# Patient Record
Sex: Male | Born: 1945 | Race: White | Hispanic: No
Health system: Southern US, Community
[De-identification: ages and names within clinical notes are randomized; demographics above are authoritative.]

## PROBLEM LIST (undated history)

## (undated) DIAGNOSIS — E119 Type 2 diabetes mellitus without complications: Secondary | ICD-10-CM

## (undated) HISTORY — DX: Type 2 diabetes mellitus without complications: E11.9

---

## 2003-08-14 ENCOUNTER — Other Ambulatory Visit: Admission: RE | Admit: 2003-08-14 | Discharge: 2003-08-14 | Payer: Self-pay | Admitting: Otolaryngology

## 2003-08-21 ENCOUNTER — Encounter: Admission: RE | Admit: 2003-08-21 | Discharge: 2003-08-21 | Payer: Self-pay | Admitting: Otolaryngology

## 2003-09-04 ENCOUNTER — Encounter (INDEPENDENT_AMBULATORY_CARE_PROVIDER_SITE_OTHER): Payer: Self-pay | Admitting: *Deleted

## 2003-09-04 ENCOUNTER — Ambulatory Visit (HOSPITAL_COMMUNITY): Admission: RE | Admit: 2003-09-04 | Discharge: 2003-09-04 | Payer: Self-pay | Admitting: Otolaryngology

## 2003-10-12 ENCOUNTER — Encounter: Admission: RE | Admit: 2003-10-12 | Discharge: 2003-10-12 | Payer: Self-pay | Admitting: Otolaryngology

## 2003-10-25 ENCOUNTER — Encounter (INDEPENDENT_AMBULATORY_CARE_PROVIDER_SITE_OTHER): Payer: Self-pay | Admitting: Specialist

## 2003-10-25 ENCOUNTER — Ambulatory Visit (HOSPITAL_COMMUNITY): Admission: RE | Admit: 2003-10-25 | Discharge: 2003-10-26 | Payer: Self-pay | Admitting: Otolaryngology

## 2004-09-09 ENCOUNTER — Encounter: Admission: RE | Admit: 2004-09-09 | Discharge: 2004-09-09 | Payer: Self-pay | Admitting: Otolaryngology

## 2006-09-14 ENCOUNTER — Ambulatory Visit: Payer: Self-pay | Admitting: Hematology and Oncology

## 2006-09-24 LAB — CBC WITH DIFFERENTIAL/PLATELET
BASO%: 0.7 % (ref 0.0–2.0)
HCT: 53.4 % — ABNORMAL HIGH (ref 38.7–49.9)
LYMPH%: 26.9 % (ref 14.0–48.0)
MCH: 30.5 pg (ref 28.0–33.4)
MCV: 86.6 fL (ref 81.6–98.0)
MONO%: 6.2 % (ref 0.0–13.0)
NEUT#: 4.9 10*3/uL (ref 1.5–6.5)
NEUT%: 65.7 % (ref 40.0–75.0)
Platelets: 150 10*3/uL (ref 145–400)
WBC: 7.5 10*3/uL (ref 4.0–10.0)

## 2006-09-25 ENCOUNTER — Ambulatory Visit (HOSPITAL_COMMUNITY): Admission: RE | Admit: 2006-09-25 | Discharge: 2006-09-25 | Payer: Self-pay | Admitting: Hematology and Oncology

## 2006-09-29 LAB — COMPREHENSIVE METABOLIC PANEL
ALT: 20 U/L (ref 0–53)
Albumin: 4.3 g/dL (ref 3.5–5.2)
Alkaline Phosphatase: 71 U/L (ref 39–117)
CO2: 19 mEq/L (ref 19–32)
Calcium: 8.8 mg/dL (ref 8.4–10.5)
Creatinine, Ser: 0.97 mg/dL (ref 0.40–1.50)
Glucose, Bld: 107 mg/dL — ABNORMAL HIGH (ref 70–99)
Potassium: 3.9 mEq/L (ref 3.5–5.3)
Sodium: 138 mEq/L (ref 135–145)
Total Bilirubin: 0.9 mg/dL (ref 0.3–1.2)
Total Protein: 6.3 g/dL (ref 6.0–8.3)

## 2006-09-29 LAB — VITAMIN B12: Vitamin B-12: 216 pg/mL (ref 211–911)

## 2006-09-29 LAB — ERYTHROPOIETIN: Erythropoietin: 6.8 m[IU]/mL (ref 2.6–34.0)

## 2006-10-02 LAB — VON WILLEBRAND PANEL
Factor-VIII Activity: 89 % (ref 75–150)
Ristocetin-Cofactor: 129 % (ref 50–150)
Von Willebrand Ag: 123 % normal (ref 61–164)

## 2006-10-29 ENCOUNTER — Ambulatory Visit: Payer: Self-pay | Admitting: Emergency Medicine

## 2006-10-29 LAB — CONVERTED CEMR LAB
CO2: 29 meq/L (ref 19–32)
Calcium: 8.8 mg/dL (ref 8.4–10.5)
Chloride: 106 meq/L (ref 96–112)
Glucose, Bld: 73 mg/dL (ref 70–99)
Potassium: 3.8 meq/L (ref 3.5–5.1)

## 2006-11-02 DIAGNOSIS — J984 Other disorders of lung: Secondary | ICD-10-CM | POA: Insufficient documentation

## 2006-11-02 DIAGNOSIS — K8020 Calculus of gallbladder without cholecystitis without obstruction: Secondary | ICD-10-CM | POA: Insufficient documentation

## 2006-11-02 DIAGNOSIS — K5732 Diverticulitis of large intestine without perforation or abscess without bleeding: Secondary | ICD-10-CM | POA: Insufficient documentation

## 2006-11-02 DIAGNOSIS — I739 Peripheral vascular disease, unspecified: Secondary | ICD-10-CM

## 2006-11-27 ENCOUNTER — Ambulatory Visit: Payer: Self-pay | Admitting: Hematology and Oncology

## 2006-11-30 ENCOUNTER — Ambulatory Visit: Payer: Self-pay | Admitting: Cardiology

## 2006-12-01 LAB — CBC WITH DIFFERENTIAL/PLATELET
BASO%: 0.2 % (ref 0.0–2.0)
EOS%: 0.7 % (ref 0.0–7.0)
HGB: 18.9 g/dL — ABNORMAL HIGH (ref 13.0–17.1)
MCH: 30.4 pg (ref 28.0–33.4)
MCHC: 34.5 g/dL (ref 32.0–35.9)
MONO%: 5.8 % (ref 0.0–13.0)
RBC: 6.2 10*6/uL — ABNORMAL HIGH (ref 4.20–5.71)
RDW: 14.6 % (ref 11.2–14.6)
lymph#: 2.1 10*3/uL (ref 0.9–3.3)

## 2006-12-09 ENCOUNTER — Other Ambulatory Visit: Admission: RE | Admit: 2006-12-09 | Discharge: 2006-12-09 | Payer: Self-pay | Admitting: Hematology and Oncology

## 2006-12-09 LAB — CBC WITH DIFFERENTIAL/PLATELET
Basophils Absolute: 0.1 10*3/uL (ref 0.0–0.1)
Eosinophils Absolute: 0.1 10*3/uL (ref 0.0–0.5)
HGB: 18.2 g/dL — ABNORMAL HIGH (ref 13.0–17.1)
MONO#: 0.5 10*3/uL (ref 0.1–0.9)
MONO%: 6.3 % (ref 0.0–13.0)
NEUT#: 5.5 10*3/uL (ref 1.5–6.5)
RBC: 5.96 10*6/uL — ABNORMAL HIGH (ref 4.20–5.71)
RDW: 14.2 % (ref 11.2–14.6)
WBC: 8.2 10*3/uL (ref 4.0–10.0)
lymph#: 2 10*3/uL (ref 0.9–3.3)

## 2006-12-09 LAB — ERYTHROPOIETIN: Erythropoietin: 7.1 m[IU]/mL (ref 2.6–34.0)

## 2007-01-29 ENCOUNTER — Ambulatory Visit: Payer: Self-pay | Admitting: Hematology and Oncology

## 2007-02-02 LAB — CBC WITH DIFFERENTIAL/PLATELET
BASO%: 0.5 % (ref 0.0–2.0)
Eosinophils Absolute: 0 10*3/uL (ref 0.0–0.5)
HCT: 54.8 % — ABNORMAL HIGH (ref 38.7–49.9)
HGB: 18.9 g/dL — ABNORMAL HIGH (ref 13.0–17.1)
MCHC: 34.4 g/dL (ref 32.0–35.9)
MONO#: 0.5 10*3/uL (ref 0.1–0.9)
NEUT#: 5.4 10*3/uL (ref 1.5–6.5)
NEUT%: 69.8 % (ref 40.0–75.0)
WBC: 7.8 10*3/uL (ref 4.0–10.0)
lymph#: 1.8 10*3/uL (ref 0.9–3.3)

## 2007-02-02 LAB — BASIC METABOLIC PANEL
CO2: 25 mEq/L (ref 19–32)
Chloride: 104 mEq/L (ref 96–112)
Creatinine, Ser: 1.13 mg/dL (ref 0.40–1.50)
Glucose, Bld: 104 mg/dL — ABNORMAL HIGH (ref 70–99)

## 2007-02-12 ENCOUNTER — Ambulatory Visit: Payer: Self-pay | Admitting: Emergency Medicine

## 2007-02-12 DIAGNOSIS — Z87891 Personal history of nicotine dependence: Secondary | ICD-10-CM

## 2007-02-12 DIAGNOSIS — R942 Abnormal results of pulmonary function studies: Secondary | ICD-10-CM | POA: Insufficient documentation

## 2007-02-12 DIAGNOSIS — D751 Secondary polycythemia: Secondary | ICD-10-CM

## 2007-02-22 ENCOUNTER — Encounter: Admission: RE | Admit: 2007-02-22 | Discharge: 2007-02-22 | Payer: Self-pay | Admitting: Gastroenterology

## 2007-02-24 ENCOUNTER — Inpatient Hospital Stay (HOSPITAL_COMMUNITY): Admission: RE | Admit: 2007-02-24 | Discharge: 2007-03-05 | Payer: Self-pay | Admitting: Gastroenterology

## 2007-02-26 ENCOUNTER — Encounter (INDEPENDENT_AMBULATORY_CARE_PROVIDER_SITE_OTHER): Payer: Self-pay | Admitting: Surgery

## 2007-03-09 ENCOUNTER — Ambulatory Visit: Payer: Self-pay | Admitting: Gastroenterology

## 2007-03-29 ENCOUNTER — Ambulatory Visit: Payer: Self-pay | Admitting: Hematology and Oncology

## 2007-04-28 LAB — CBC WITH DIFFERENTIAL/PLATELET
HCT: 46.3 % (ref 38.7–49.9)
HGB: 16.1 g/dL (ref 13.0–17.1)
MCHC: 34.8 g/dL (ref 32.0–35.9)
MONO#: 0.4 10*3/uL (ref 0.1–0.9)
NEUT%: 65.2 % (ref 40.0–75.0)
RBC: 5.29 10*6/uL (ref 4.20–5.71)
RDW: 15.6 % — ABNORMAL HIGH (ref 11.2–14.6)

## 2007-04-28 LAB — BASIC METABOLIC PANEL
BUN: 13 mg/dL (ref 6–23)
Calcium: 8.7 mg/dL (ref 8.4–10.5)
Chloride: 105 mEq/L (ref 96–112)
Sodium: 141 mEq/L (ref 135–145)

## 2007-06-07 ENCOUNTER — Inpatient Hospital Stay (HOSPITAL_COMMUNITY): Admission: RE | Admit: 2007-06-07 | Discharge: 2007-06-11 | Payer: Self-pay | Admitting: Surgery

## 2007-06-07 ENCOUNTER — Encounter (INDEPENDENT_AMBULATORY_CARE_PROVIDER_SITE_OTHER): Payer: Self-pay | Admitting: Surgery

## 2007-06-16 ENCOUNTER — Encounter: Payer: Self-pay | Admitting: Gastroenterology

## 2007-07-09 ENCOUNTER — Encounter: Admission: RE | Admit: 2007-07-09 | Discharge: 2007-07-09 | Payer: Self-pay | Admitting: Surgery

## 2007-07-19 ENCOUNTER — Ambulatory Visit: Payer: Self-pay | Admitting: Hematology and Oncology

## 2007-09-16 ENCOUNTER — Encounter (INDEPENDENT_AMBULATORY_CARE_PROVIDER_SITE_OTHER): Payer: Self-pay | Admitting: Surgery

## 2007-09-16 ENCOUNTER — Inpatient Hospital Stay (HOSPITAL_COMMUNITY): Admission: RE | Admit: 2007-09-16 | Discharge: 2007-09-20 | Payer: Self-pay | Admitting: Surgery

## 2008-07-05 IMAGING — RF DG BE THRU COLOSTOMY
17 of 23 series · 17 of 23 positions shown · IV contrast (omnipaque)
Comparison: CT scan dated 02/22/2007

CLINICAL DATA: Preoperative exam for colostomy reversal. Previous
distal sigmoid stenosis.  Diverticulosis.

SINGLE CONTRAST  ENEMA
Contrast: Omnipaque 300

[Series 1: run · 1 of 1 slices shown (1 of 16)]
[im 1/1]
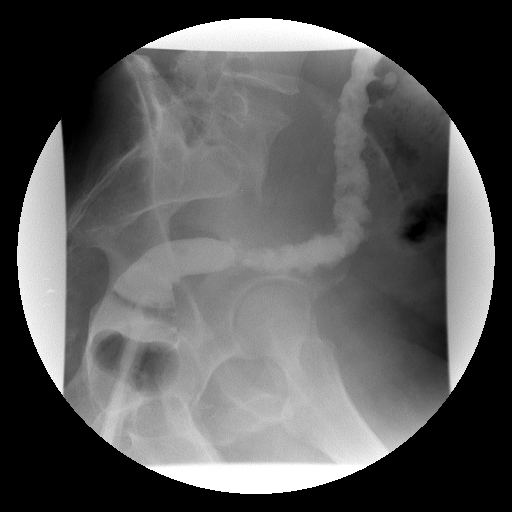

[Series 3: run · 1 of 1 slices shown (2 of 16)]
[im 1/1]
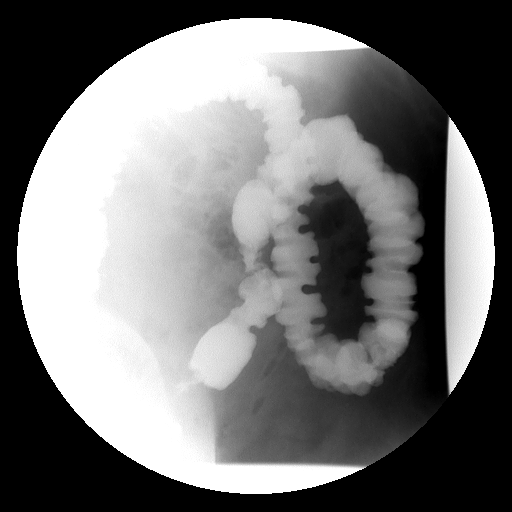

[Series 4: run · 1 of 1 slices shown (3 of 16)]
[im 1/1]
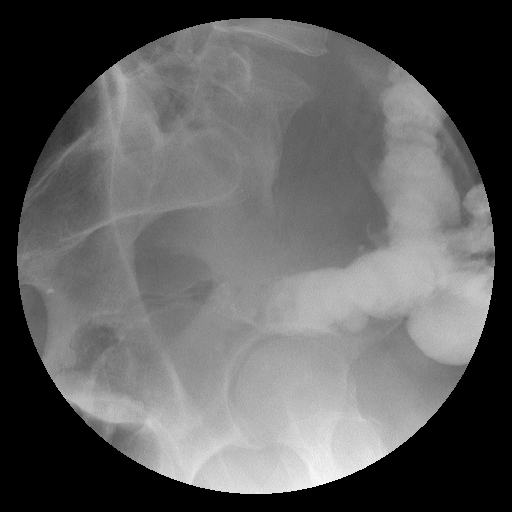

[Series 5: run · 1 of 1 slices shown (4 of 16)]
[im 1/1]
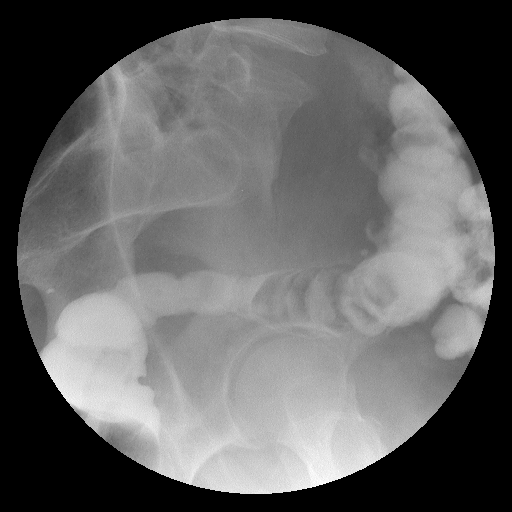

[Series 7: run · 1 of 1 slices shown (5 of 16)]
[im 1/1]
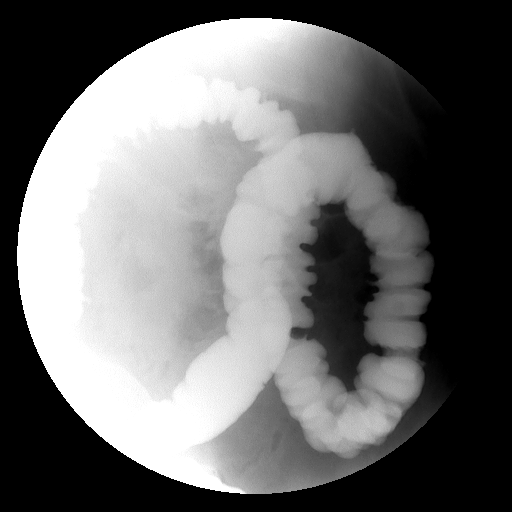

[Series 8: run · 1 of 1 slices shown (6 of 16)]
[im 1/1]
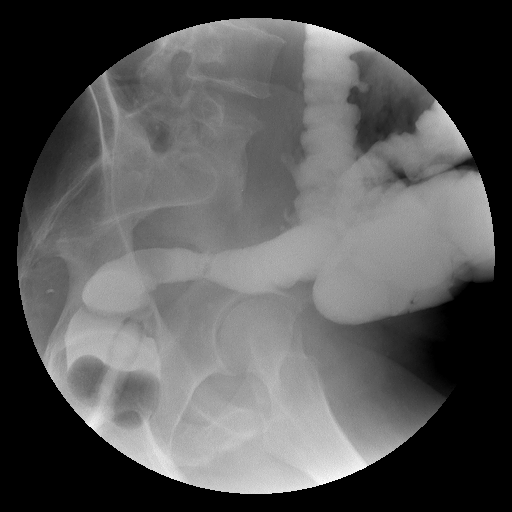

[Series 9: run · 1 of 1 slices shown (7 of 16)]
[im 1/1]
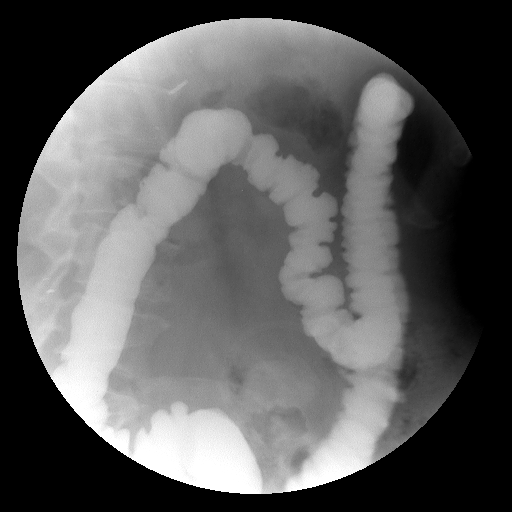

[Series 11: run · 1 of 1 slices shown (8 of 16)]
[im 1/1]
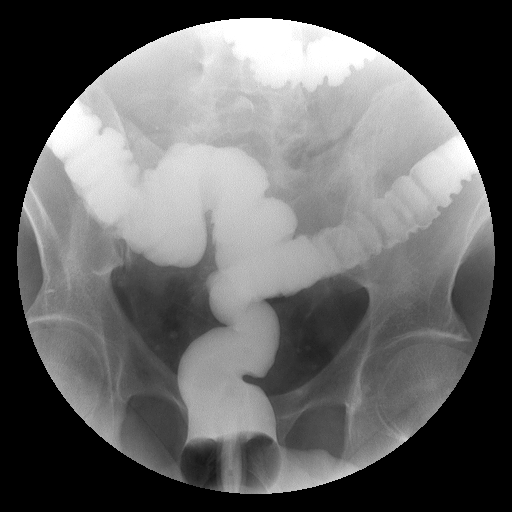

[Series 12: run · 1 of 1 slices shown (9 of 16)]
[im 1/1]
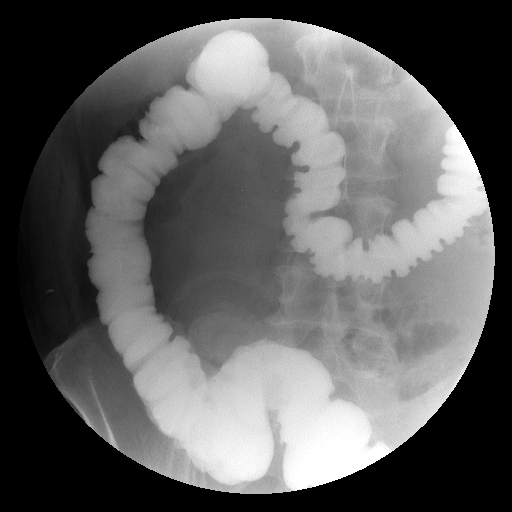

[Series 13: run · 1 of 1 slices shown (10 of 16)]
[im 1/1]
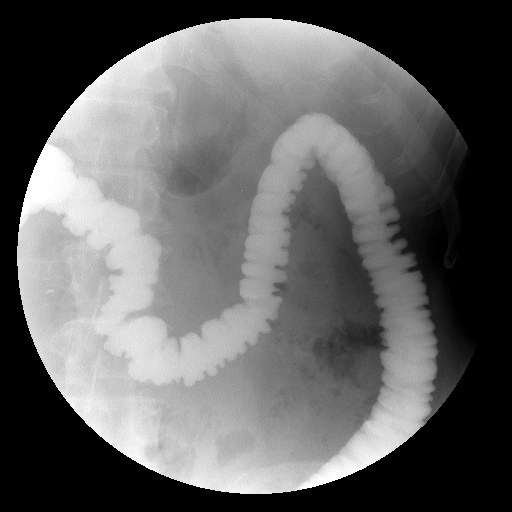

[Series 15: run · 1 of 1 slices shown (11 of 16)]
[im 1/1]
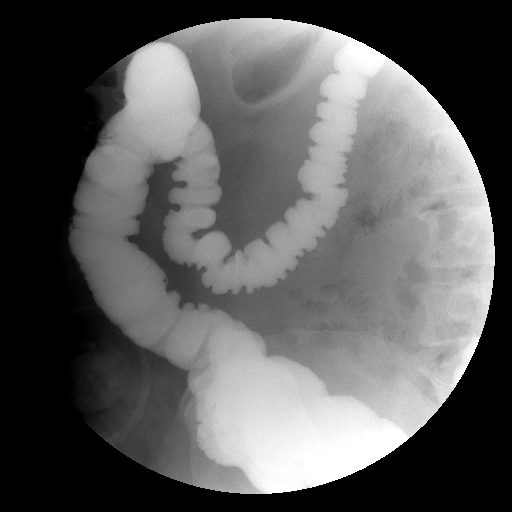

[Series 16: run · 1 of 1 slices shown (12 of 16)]
[im 1/1]
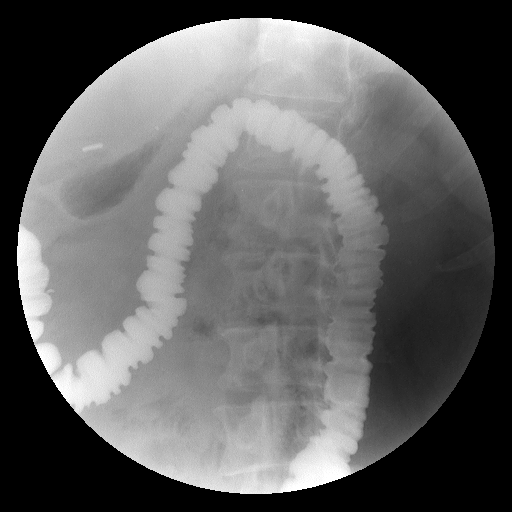

[Series 17: run · 1 of 1 slices shown (13 of 16)]
[im 1/1]
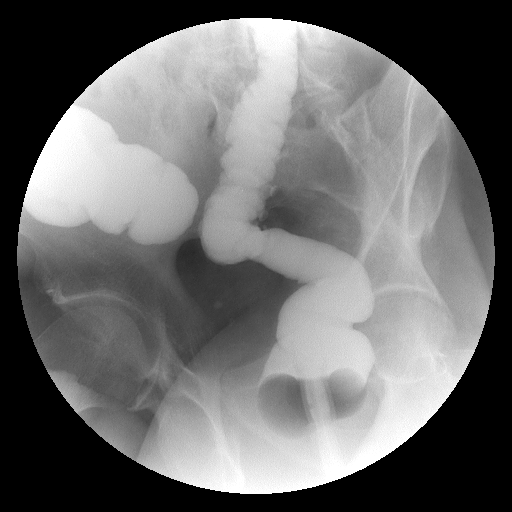

[Series 19: run · 1 of 1 slices shown (14 of 16)]
[im 1/1]
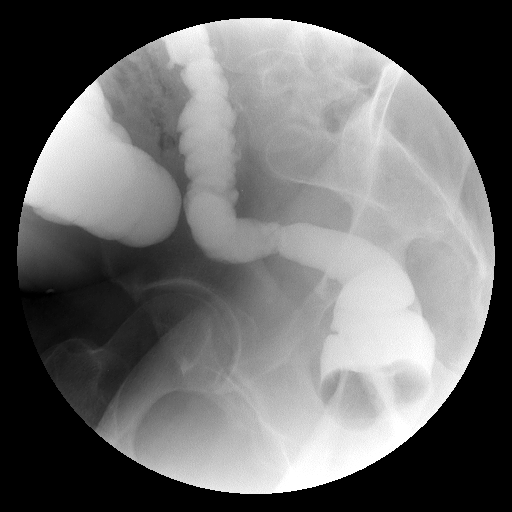

[Series 20: run · 1 of 1 slices shown (15 of 16)]
[im 1/1]
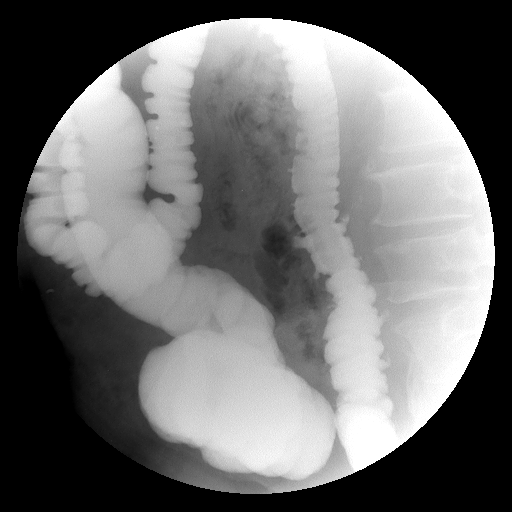

[Series 21: run · 1 of 1 slices shown (16 of 16)]
[im 1/1]
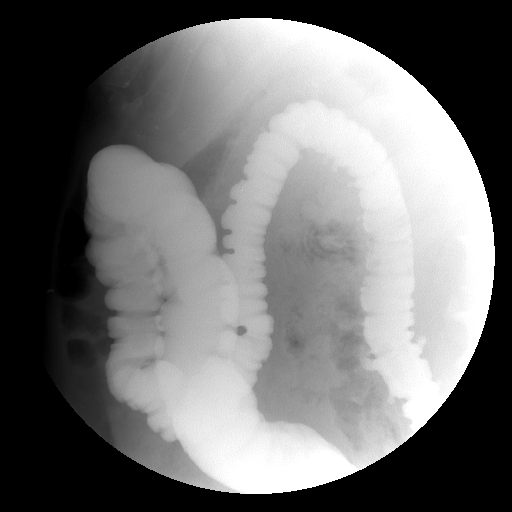

[Series 1002: view not recorded · 0.20mm/px · 1 of 1 slices shown]
[im 1/1]
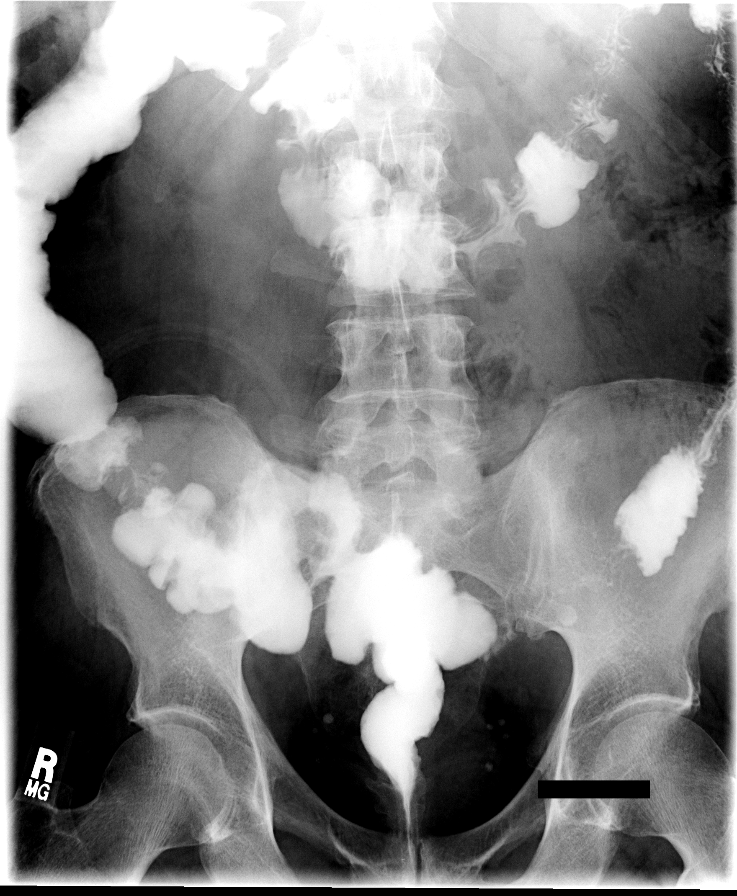

[17 of 23 positions shown; findings below may reference images not displayed]

FINDINGS: There are multiple diverticuli in the distal descending
and proximal sigmoid portions of the colon.  There is no evidence
of leak from the distal colonic anastomosis.  There is some very
minimal narrowing at that site but  this area appears to expand
contract well.

Ileostomy is noted in the right lower quadrant.  There is no reflux
into the terminal ileum.

There is no evidence of mass or structure elsewhere in the colon.
IMPRESSION: Diverticulosis of the distal colon.  No evidence of leak or other
abnormality of significance at the level of the distal colonic
anastomosis.

## 2008-10-06 ENCOUNTER — Encounter: Admission: RE | Admit: 2008-10-06 | Discharge: 2008-10-06 | Payer: Self-pay | Admitting: Surgery

## 2008-10-24 ENCOUNTER — Ambulatory Visit (HOSPITAL_COMMUNITY): Admission: RE | Admit: 2008-10-24 | Discharge: 2008-10-25 | Payer: Self-pay | Admitting: Surgery

## 2010-05-10 LAB — CBC
HCT: 50.9 % (ref 39.0–52.0)
Hemoglobin: 17 g/dL (ref 13.0–17.0)
MCV: 90.7 fL (ref 78.0–100.0)
Platelets: 122 10*3/uL — ABNORMAL LOW (ref 150–400)
RBC: 5.61 MIL/uL (ref 4.22–5.81)
RDW: 14.8 % (ref 11.5–15.5)

## 2010-05-10 LAB — COMPREHENSIVE METABOLIC PANEL
ALT: 28 U/L (ref 0–53)
Alkaline Phosphatase: 71 U/L (ref 39–117)
CO2: 30 mEq/L (ref 19–32)
Calcium: 9.5 mg/dL (ref 8.4–10.5)
Chloride: 105 mEq/L (ref 96–112)
Creatinine, Ser: 1.12 mg/dL (ref 0.4–1.5)
GFR calc Af Amer: >60 mL/min (ref >60)
Glucose, Bld: 97 mg/dL (ref 70–99)
Potassium: 4.6 mEq/L (ref 3.5–5.1)
Total Bilirubin: 0.8 mg/dL (ref 0.3–1.2)

## 2010-05-10 LAB — DIFFERENTIAL
Lymphs Abs: 1.8 10*3/uL (ref 0.7–4.0)
Monocytes Relative: 8 % (ref 3–12)
Neutrophils Relative %: 62 % (ref 43–77)

## 2010-06-18 NOTE — H&P (Signed)
[name], Conrad             ACCOUNT NO.:  [id number]   MEDICAL RECORD NO.:  [id number]          PATIENT TYPE:  INP   LOCATION:  2853                         FACILITY:  [facility]   PHYSICIAN:  Danise Edge, M.D.   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   HISTORY OF PRESENT ILLNESS:  Jason Conrad is a 65 year old male  born [birth year].  In July 2008 Jason Conrad was treated with  antibiotics for acute diverticulitis.  His September 07, 2006 CT scan of the  abdomen and pelvis showed gallstones and colonic diverticulosis.   On October 13, 2006 a proctocolonoscopy to the cecum resulted in the  removal of a 2 mm tubular adenomatous polyp from the sigmoid colon,  extensive sigmoid diverticulosis was present.  At approximately 30 cm  from the anal verge, the sigmoid colonic mucosa was intensely red and  edematous.  The biopsies returned mucosal fibrosis.  Neoplasia was not  identified at that area.   On February 22, 2007 Jason Conrad underwent a repeat CT scan of the  abdomen and pelvis which revealed gallstones and generalized colonic  distention up to 8 cm diameter extending from the cecum to the distal  sigmoid colon, but no signs of diverticulitis.  Differential diagnosis  included a diverticular stricture versus neoplasm.   Jason Conrad consumed 64 ounces of the MiraLax - Gatorade colonic lavage  prep on February 23, 2007.  He did not pass any fluid following  consumption of the colonic lavage prep.   ALLERGIES:  NONE.   CURRENT MEDICATIONS:  Aspirin 81 mg.   PAST MEDICAL AND SURGICAL HISTORY:  Diarrhea-predominant irritable bowel  syndrome, gallstones, diverticulitis, appendectomy, shoulder surgery,  thyroid surgery for thyroid cysts, ankle surgery, peripheral vascular  disease involving the lower  extremities, chronic right upper lobe lung  nodule (7 mm x 4 mm) stable since 2006.   HABITS:  Jason Conrad is a 1 pack per day cigarette smoker and consumes  alcohol in moderation.   FAMILY HISTORY:  Negative for colon cancer.   PHYSICAL EXAMINATION:  VITAL SIGNS:  Blood pressure 119/67 at the  conclusion of his colonoscopy.  GENERAL APPEARANCE:  Jason Conrad appears healthy.  HEENT:  Sclerae nonicteric.  Normal oropharynx.  NECK:  No adenopathy.  LUNGS:  Clear to auscultation.  CARDIOVASCULAR:  Regular rhythm without murmurs.  ABDOMEN:  Softly distended and nontender.   PROCEDURE:  Proctocolonoscopy to 55 cm from the anal verge.   PREMEDICATION:  Fentanyl 75 mcg, Versed 7 mg.   PROCEDURE:  After obtaining informed consent Jason Conrad was placed in  the left lateral decubitus position.  I administered intravenous  fentanyl and intravenous Versed to achieve conscious sedation for the  procedure.  The patient's blood pressure, oxygen saturation and cardiac  rhythm were monitored throughout the procedure and documented in the  medical record.   Anal inspection and digital rectal exam were normal.  The Pentax  pediatric colonoscope  was introduced into the rectum which was filled  with liquid stool.  The colonoscope was advanced into the sigmoid colon.  At 30 cm from the anal verge the sigmoid colonic mucosa became intensely  red and narrowed.  I could not identify obvious cancer-appearing mucosa.  Prior biopsies from this area showed fibrosis.  With careful advancement  and some difficulty I was able to traverse the narrowed segment of the  colon and enter the proximal sigmoid colon which was moderately  distended with air and filled with liquid stool.  As much of this was  suctioned through the endoscope as possible.   ASSESSMENT:  Distal sigmoid colon obstruction at 30 cm from the anal  verge, probably due to a diverticular stricture.   PLAN:  I am admitting Jason Conrad to the  hospital for surgical  evaluation to relieve his distal sigmoid colonic obstruction.  I will  try enemas from below to encourage passage of the colonic lavage prep  prior to surgery.           ______________________________  Danise Edge, M.D.     MJ/MEDQ  D:  02/24/2007  T:  02/24/2007  Job:  [id number]

## 2010-06-18 NOTE — Op Note (Signed)
Jason Conrad, Jason Conrad             ACCOUNT NO.:  [id number]   MEDICAL RECORD NO.:  [id number]         PATIENT TYPE:  LINP   LOCATION:                               FACILITY:  [facility]   PHYSICIAN:  Wilmon Arms. [name], M.D. DATE OF BIRTH:  1945/07/30   DATE OF PROCEDURE:  09/16/2007  DATE OF DISCHARGE:                               OPERATIVE REPORT   PREOPERATIVE DIAGNOSIS:  Loop ileostomy.   POSTOPERATIVE DIAGNOSIS:  Loop ileostomy.   PROCEDURE PERFORMED:  Ileostomy closure.   SURGEON:  Wilmon Arms. [name], M.D.   ASSISTANT:  Sandria Bales. Ezzard Standing, M.D.   ANESTHESIA:  General endotracheal.   INDICATIONS:  The patient is a 65 year old male who had presented with  obstructive diverticulitis.  He then had a colon resection with creation  of a descending colostomy.  He underwent colostomy closure on Jun 07, 2007.  We had a difficult time creating his anastomosis.  He was  therefore given a diverting loop ileostomy.  He had a barium enema July 09, 2007, which showed no sign of leak or any other abnormalities of the  distal colonic anastomosis.  He presents now for ileostomy closure.   DESCRIPTION OF PROCEDURE:  The patient was brought to the operating  room, placed in a supine position on the operating table.  After an  adequate level of general anesthesia was obtained the patient's abdomen  was shaved around the ileostomy site.  A Foley catheter was placed.  His  abdomen was prepped with Betadine and draped in sterile fashion.  A time-  out was taken to ensure the proper patient and proper procedure.  We  made an elliptical incision around his old ileostomy after closing it  with a pursestring suture.  We dissected down in the subcutaneous  tissues with cautery.  We dissected all the way down to the fascia.  We  continued dissecting the terminal ileum free from the surrounding  tissues to where we were in the peritoneal cavity.  We broke up some of  these flimsy adhesions just with blunt  dissection.  The small bowel  mobilized easily up into the wound.  We then created a side-to-side  stapled anastomosis with the GIA75 stapler.  We closed the enterotomy  and amputated the end of the ileostomy with a second firing of the GIA75  stapler.  A reinforcing suture of 3-0 silk was placed at the crotch of  the anastomosis.  The anastomosis was felt to be patent and was placed  back into the peritoneal cavity.  We thoroughly irrigated with saline.  The fascia was cleared both anteriorly and posteriorly for a couple of  inches in all directions.  The fascia was then reapproximated with zero  PDS.  The subcutaneous tissues were thoroughly irrigated.  Loose  staples were used to close the skin.  All sponge and instrument counts  were correct.  The patient was extubated and brought to the recovery  room in stable condition.  The Foley catheter was removed prior to  leaving the operating room.      Wilmon Arms. Tsuei, M.D.  Electronically Signed     MKT/MEDQ  D:  09/16/2007  T:  09/16/2007  Job:  [id number]

## 2010-06-18 NOTE — Discharge Summary (Signed)
Jason Conrad, Jason Conrad             ACCOUNT NO.:  [id number]   MEDICAL RECORD NO.:  [id number]          PATIENT TYPE:  INP   LOCATION:  1523                         FACILITY:  [facility]   PHYSICIAN:  Wilmon Arms. [name], M.D. DATE OF BIRTH:  [birth year]   DATE OF ADMISSION:  09/16/2007  DATE OF DISCHARGE:  09/20/2007                               DISCHARGE SUMMARY   ADMISSION DIAGNOSIS:  Loop ileostomy status post recent colostomy  closure.   DISCHARGE DIAGNOSIS:  Loop ileostomy status post recent colostomy  closure.   PROCEDURE:  Ileostomy closure.   BRIEF HISTORY:  The patient is a 65 year old male who previously had  presented with obstructive sigmoid diverticulitis.  He had a colon  resection with creation of a descending colostomy.  In May 2009 he  underwent a colostomy closure, but his rectal anastomosis was fairly  tenuous.  He was given a diverting loop ileostomy.  He was given some  time to heal his rectal anastomosis, and this was confirmed on barium  enema.  He presents now for ileostomy closure.   HOSPITAL COURSE:  The patient underwent an ileostomy closure through his  right lower quadrant incision on September 16, 2007.  He had a stapled side-  to-side anastomosis.  Postoperatively the patient's wound was loosely  closed with staples.  He has done well.  It took him 3 days before he  began having some bowel movements.  He is being discharged home on  postop day #4.  He has had several loose bowel movements as well as a  lot of flatus.  His wound is draining minimal amounts of serous  drainage.  His abdomen is soft.  His pain is controlled with Percocet.   DISCHARGE INSTRUCTIONS:  He is given Percocet p.r.n. for pain.  Follow  up in 2 days for wound check and possible staple removal.      Wilmon Arms. Tsuei, M.D.  Electronically Signed     MKT/MEDQ  D:  09/20/2007  T:  09/20/2007  Job:  [id number]

## 2010-06-18 NOTE — Discharge Summary (Signed)
Jason Conrad, Jason Conrad             ACCOUNT NO.:  [id number]   MEDICAL RECORD NO.:  [id number]          PATIENT TYPE:  INP   LOCATION:  5157                         FACILITY:  [facility]   PHYSICIAN:  Wilmon Arms. [name], M.D. DATE OF BIRTH:  [birth year]   DATE OF ADMISSION:  06/07/2007  DATE OF DISCHARGE:  06/11/2007                               DISCHARGE SUMMARY   ADMISSION DIAGNOSES:  Descending colostomy, status post diverticular  stricture with a sigmoid colectomy.   DISCHARGE DIAGNOSES:  Descending colostomy, status post diverticular  stricture with a sigmoid colectomy.   PROCEDURE:  Colostomy closure with creation of diverting loop ileostomy.   BRIEF HISTORY:  The patient is a 65 year old male who was initially seen  in July 2008 with acute diverticulitis.  He also had gallstones.  His  cholelithiasis was asymptomatic.  The patient has continued to have  problems with diverticular disease.  He developed a near complete  obstruction and was admitted to hospital in January 2009.  Several  attempts were made at colonic stenting but this was not successful  decompressing his colon.  He underwent a sigmoid colectomy with  Hartmann's procedure in January 2009.  The patient had a descending  colostomy and since that time has done well and now presents for  colostomy reversal.   HOSPITAL COURSE:  The patient underwent a bowel prep at home.  He was  admitted to hospital on Jun 07, 2007.  He then underwent colostomy  closure.  The patient had extensive pelvic adhesions.  We were able to  mobilize the rectal stump.  However, the rectal stump was remarkably  thickened.  He may have had some diversion proctitis cosmos.  We had a  very difficult time passing the EEA stapler.  After extensive  dissection, we were able to perform an end-to-end EEA anastomosis.  However, when the anastomosis was tested, there was a leak in the  anterior wall.  Several sutures were placed to repair this anterior  leak.  The anastomosis was again tested and showed no signs of air leak.  However, given the difficulty of anastomosis, the decision was made to  protect this area with a loop ileostomy.  Postoperatively, the patient  has done quite well.  He regained bowel function within a couple of  days.  He is comfortable taking care of his ostomy pouch.  His wound in  the lower midline has been healing well.  There is some thin serous  drainage coming from the midportion of the wound but this has been  decreasing.  On the day of discharge, there is no erythema around the  wound.  He does have some tape blisters.  The left lower quadrant ostomy  site is healing well with no sign of infection.  He does have a [employer] drain, which  runs down into his pelvis.  This is draining large  amounts of thin serous fluid.   DISCHARGE INSTRUCTIONS:  The patient has been instructed on care of his  Jackson-Pratt drain.  He should follow up in 1 week for drain removal  and possible staple removal.  He is given Percocet p.r.n. for pain.  Resume regular diet.  No heavy lifting.      Wilmon Arms. Tsuei, M.D.  Electronically Signed     MKT/MEDQ  D:  06/11/2007  T:  06/12/2007  Job:  [id number]

## 2010-06-18 NOTE — Assessment & Plan Note (Signed)
[facility]                             PULMONARY OFFICE NOTE   Jason Conrad, Jason Conrad                    MRN:          [id number]  DATE:10/29/2006                            DOB:          1945/11/16    REASON FOR CONSULTATION:  We were asked by Dr. Dalene Carrow with the [facility] to evaluate Jason Conrad for an abnormal CT  scan of the chest.   BRIEF HISTORY:  Jason Conrad is a 65 year old gentleman with a history of  tobacco abuse who was sent to Dr. Arlan Organ for evaluation of  polycythemia. He was undergoing evaluation for diverticular disease when  it was noted that his hematocrit on September 07, 2006 was 57.4%. As part of  his evaluation he underwent a CT scan of the abdomen and chest. The  abdominal CT showed normal liver and spleen size, normal kidneys, and  some evidence for diverticulitis. The CT scan of the chest demonstrated  a stable subpleural right upper lobe nodule that had been originally  identified in August of 2006, also noted was a less well defined hazy  peripheral right mid lobe opacity that was not present on his original  scan. He is referred for evaluation of his abnormal CT.  He tells me  that he continues to smoke one and a quater packs of cigarettes daily.  He is not having any breathing symptoms, in fact his exertional  tolerance is very good. He rides a bike 20 miles at a time. He  occasionally has coughing especially when he talks for long periods of  time. He denies any wheezing or other significant pulmonary symptoms. He  has not had any chest pain.   PAST MEDICAL HISTORY:  1. History of partial thyroidectomy due to cysts that were noted on CT      scan of the chest in 2006.  2. History of right upper lobe nodule on his CT of the chest in 2006      which has been stable on serial scans.  3. Acute viral myositis 10 years ago.  4. Status post appendectomy.  5. Status post shoulder surgery.  6.  Status post ankle surgery.  7. Peripheral vascular disease in his bilateral lower extremities.  8. Gallstones.  9. Diverticulitis.   MEDICATION ALLERGIES:  No known drug allergies.   CURRENT MEDICATIONS:  1. Vitamin B12 supplements once daily.  2. Aspirin 81 mg daily.   SOCIAL HISTORY:  The patient is retired. He used to work in [employer]. He lives alone and is single. He has smoked for about 20  years with about 30-pack-year total history. He currently smokes 1.25  packs daily. He drinks 1 alcoholic beverage per month.   FAMILY HISTORY:  Significant for COPD in his father and hepatic cancer  in his sister.   REVIEW OF SYSTEMS:  As per the HPI. Also please note that he has had  some abdominal discomfort due to diverticular disease. He also has  bilateral lower extremity and foot pain due to his peripheral vascular  disease.  PHYSICAL EXAMINATION:  IN GENERAL: This is a pleasant gentleman in no  distress, on room air. His weight is 227 pounds, temperature 97.9, blood  pressure 128/80, heart rate 75, SPO2 95% on room air.  HEENT EXAM: The oropharynx is benign. He has no posterior pharyngeal  erythema.  NECK: Supple without lymphadenopathy or strider.  LUNGS: Distant but clear. He has no wheezing on a forced expiration.  There are no crackles. His costal margins are nontender to palpation. He  has some right lower lobe inspiratory crackles.  HEART: Regular without murmur.  ABDOMEN: Obese, soft, nontender with positive bowel sounds.  EXTREMITIES: Has some trace pretibial edema. He has significant  bilateral foot erythema skin changes and hair loss consistent with his  peripheral vascular disease.   CT scan of the chest performed on October 01, 2006 shows a 7 x 4 mm right  upper lobe posterior nodule that has been stable since September 09, 2004.  There are some mild changes consistent with COPD. There is a linear band  of density on one cut of his CT in the upper  portion of the right middle  lobe that looks most consistent with an ill formed inflammatory focus or  atelectasis. It does not have any particular spiculation or nodularity.  There is no hilar or mediastinal lymphadenopathy.   IMPRESSION:  1. History of tobacco abuse with continued use.  2. Right middle opacity that is new compared with his old CT scan of      the chest in 2006. This appears to be an inflammatory or      atelectatic focus, although malignancy can not be excluded.  3. Stable right upper lobe nodule since 2006.   PLAN:  1. I will repeat a CT scan of his chest with contrast in 1 month to      reevaluate the middle lobe opacity. If the opacity persists then at      that time we will need to discuss appropriate work up which may      include either serial CT scans of the chest versus biopsy.  2. We discussed smoking cessation and we will readdress this issue at      our next visit.  3. We will consider pulmonary function testing in the future given his      tobacco history.  4. I will perform a walking oximetry at our next visit to ensure that      he does not have occult desaturations that may be contributing to      his polycythemia.     Leslye Peer, MD  Electronically Signed    RSB/MedQ  DD: 10/30/2006  DT: 10/31/2006  Job #: [id number]   cc:   C. Duane Lope, M.D.  Lauretta I. Odogwu, M.D.

## 2010-06-18 NOTE — Discharge Summary (Signed)
Jason Conrad, PACITTI             ACCOUNT NO.:  [id number]   MEDICAL RECORD NO.:  [id number]          PATIENT TYPE:  INP   LOCATION:  5152                         FACILITY:  [facility]   PHYSICIAN:  Ollen Gross. Vernell Morgans, M.D. DATE OF BIRTH:  1945-02-14   DATE OF ADMISSION:  02/24/2007  DATE OF DISCHARGE:  03/05/2007                               DISCHARGE SUMMARY   DISCHARGING PHYSICIAN:  Dr. Carolynne Edouard.   OPERATIVE PHYSICIAN:  Wilmon Arms. Tsuei, M.D.   REASON FOR ADMISSION:  Jason Conrad is a 65 year old man with known  history of diverticular disease, treated as an outpatient in July 2008  for acute diverticulitis.  This was on his initial diagnostic workup for  this.  CT scan at that time revealed gallstones.  The patient has been  asymptomatic.  He was referred to Dr. Carolynne Edouard at [facility] surgery,  but since he was asymptomatic, elective cholecystectomy was deferred.   In September of 2008, the patient underwent colonoscopy to evaluate the  extent of his diverticular disease.  He did have significant sigmoid  colon diverticular disease, as well as prominent mucosal fibrosis and a  polyp in the sigmoid colon.  All biopsies were negative at that time.  Over the past month, the patient has had increasing abdominal pain and  bloating, with change in bowel movement frequency and consistency.  A CT  done as an outpatient on February 12, 2007 was unremarkable, except for an  area dilated colon, down to an area of decompressed colon, length 8 cm,  concerning for either a stricture from diverticular disease or  neoplastic process.  Because of this, Dr. Laural Benes set the patient up for  an outpatient colonoscopy and gave him a bowel prep.  The patient notify  Dr. Laural Benes prior to procedure that he had not passed any of the bowel  prep, and the patient was brought to the short-stay unit and  subsequently sent to the endoscopy lab for colonoscopy procedure.   During the colonoscopy, Dr. Laural Benes had  difficulty advancing the  colonoscope very far into the colon.  He got up to the third 30-m level,  where he found a significant fibrotic narrowing with a significant  erythema, no obvious neoplasm or masses were seen, and he was noted to  have liquid stool in the rectum.  He was able to pass a smaller scope  through the area and see liquid stool in the proximal sigmoid colon as  well.  Because of these findings, it was felt that the patient needed to  be admitted to the hospital, and he would need to undergo a colectomy  procedure, possibly a colostomy, as bowel prep could not be achieved.   ADMITTING DIAGNOSES:  1. Colonic stricture, secondary to diverticular disease.  2. Retained bowel prep and feces.  3. History of irritable bowel syndrome, diarrhea predominant.  4. Asymptomatic gallstones.  5. History of tobacco abuse with associated peripheral vascular      disease of the legs.  6. Tiny right upper lobe lung nodule, stable since 2006.   HOSPITAL COURSE:  The patient was admitted as  described, and same day of  admission and he was evaluated by surgical services.  Plans were to  proceed with attempts at a bowel prep and decompressing the colon.   The patient was subsequently taken to the GI lab on hospital day #2 by  Dr. Laural Benes, for placement of a colonic stent to see if that will help  in passage of the bowel prep.  At this time, the patient's abdomen is  quite distended and tympanitic, with diffuse tenderness but no guarding  or rebounding.  The patient did have a stent placed in the GI lab, per  Dr. Laural Benes, 12-cm long, 25-mm diameter.  Unfortunately, post-stent  placement, the patient had no significant improvement in his symptoms.  He did pass some liquid BMs, but unfortunately more solid material had  dammed up the stent proximal, and the following day the patient was  taken back for flexible sigmoidoscopy and it was noted that stent had  also migrated distally, and the  patient was still dammed up with  retained fecal material and bowel prep.  After discussion with the  patient, per Dr. Laural Benes, as well as Dr. [name], it was determined that  the patient would benefit this time to undergo colon resection, and  because of inadequate bowel prep he would need a colostomy in the post-  operative period.  Plans are to eventually reverse his colostomy at a  later date.  The patient agreed.   By February 26, 2007, the patient was taken to the OR by Dr. [name], where  he underwent exploratory laparotomy with sigmoid colectomy, Hartmann's  procedure, a sigmoid colon colostomy.  He also underwent an open  cholecystectomy, given his history of gallstones.  In the intraoperative  period, the patient was found to have a massively dilated colon, and his  colon stent was not easily located.  Dr. [name] was notified of this.   Over the next several days, the patient was monitored for routine post-  op care.  His ostomy was stable.  He had a mild postoperative ileus.  He  was allowed sips of clears, by post-op day #2.  On diagnostic and  clinical exam, the patient still had not passed per rectum the stent,  and the stent was still visible on x-ray in the rectosigmoid area and  could not be felt on digital rectal exam.   By postoperative day #4, the patient was taken to the GI lab where the  rectosigmoid stent was successfully removed, without any complications.  At this time the patient had an expected postoperative course.  Wound  ostomy care RN was following, to teach the patient regarding ostomy  maintenance.  The patient was beginning to have some slight redness in  his wound, and his abdomen was still un-distended with serous drainage  and was noted beginning to have cellulitic changes.  He also had some  significant scrotal and penile edema, which was treated with elevation  and ice packs.  His diet was advanced, and he was started on oral pain  medicines in the  next 24 hours.   The patient's surgical path returned back with no evidence of  malignancy, and the gallbladder showed evidence of chronic  cholecystitis.  He was passing stool per colostomy and at this point was  advanced to a solid diet.  He was having increasing redness, though to  the surgical incision and had been started on Zosyn IV, 24 hours prior.  By postoperative day #6, the patient  had marked increase in erythema of  the distal aspect of the incision and was beginning to have blood-tinged  drainage from the lower part and serous from the upper part of the  incision.  He was having low-grade temperatures of 992.  CBC was  checked, and WBCs were normal at 9,300, but because of the significant  redness, we felt the patient was evolving a postoperative wound  infection, so the majority of the staples below the umbilicus were  removed.  The patient was not found to have any purulent drainage but  was having accumulation of fresh red blood.  The area was digitally  explored.  There was no evidence of fascial defect, and the wound was  subsequently packed with normal saline 4x4s.  Important to note that  earlier in the day because the patient was ambulatory status, his  Lovenox for DVT prophylaxis has been discontinued.  He was subsequently  changed from Zosyn to Augmentin 875 mg b.i.d.   By postoperative day #7, which is date of discharge, the patient still  has had low-grade fevers during the past 24 hours of a T-max of 997, but  otherwise vital signs were stable.  He was tolerating a diet.  His  abdominal exam was markedly improved.  He had significant decrease of  cellulitic change involving the abdominal incision, especially at the  lower aspect.  The wound remained clean.  Packing showed evidence of  serous drainage only, no purulence.  His scrotal and penile edema was  resolving.  His stoma was pink.  He was having liquid and sedimentous  stool via the colostomy and was  otherwise deemed appropriate for  discharge home.   FINAL DISCHARGE DIAGNOSES:  1. Colonic obstruction, secondary to profound diverticular stricture.  2. Status post exploratory laparotomy with Hartmann's procedure.  3. Sigmoid colectomy and colostomy, as well as open cholecystectomy.  4. Colonic obstruction and retained bowel prep and feces, as well as      known diagnosis of cholelithiasis.  5. Postoperative wound infection, requiring opening of the abdominal      wound and oral antibiotics.  6. Postoperative retained colonic stricture, requiring flexible      sigmoidoscopy to remove.   DISCHARGE MEDICATIONS:  The patient will resume the following home  medications:  1. Wellbutrin as before daily.  2. Nicotine patch daily.  3. Vitamin B12 daily.  4. Aspirin 81 mg daily.   NEW MEDICATIONS:  Include:  1. Percocet 5/325 one to two every 4 hours as needed for pain.  2. Augmentin 875 mg b.i.d. for 7 days, for the postoperative wound      infection.  3. Over-the-counter ibuprofen 2-4 tablets every 8 hours, as needed for      pain, take with food.  Recommend utilizing over-the-counter      Prilosec or equivalent, while taking ibuprofen.   ACTIVITY:  The patient is allowed to return to work in 6 weeks from the  date of the operative procedure.  Final determination per Dr. [name].  He  is to increase his activity slowly.  He may walk up the steps.  He may  shower.  No lifting more than 15 pounds for 6 weeks.  No driving for 4  weeks.   DIET:  No restrictions.   WOUND CARE:  1. Normal saline packing to the open area of abdominal wound b.i.d.      Home health RN to assist.  Given the fact that the wound does not  appear to be infected, may be able to switch to once daily wound      care with a hydrogel solution to the 4x4 packing daily.  2. Routine colostomy care, again home health RN to assist.   FOLLOW-UP APPOINTMENTS:  The patient is to call Dr. Bettey Mare office at  [phone],  to be seen next week around Wednesday for a wound check and  staple removal.   ADDITIONAL INSTRUCTIONS:  The patient is to call the surgeon's office  if:  a.  Fever greater than or equal to 101 degrees Fahrenheit.  b.  New or increased belly pain.  c.  Increasing redness or changes in the drainage from the wound.  d.  Nausea, vomiting, or diarrhea.      Allison L. Rennis Harding, N.POllen Gross. Vernell Morgans, M.D.  Electronically Signed    ALE/MEDQ  D:  03/05/2007  T:  03/05/2007  Job:  [id number]   cc:   Wilmon Arms. Tsuei, M.D.  Danise Edge, M.D.

## 2010-06-18 NOTE — Op Note (Signed)
NAMEBRAYTEN, Jason Conrad             ACCOUNT NO.:  [id number]   MEDICAL RECORD NO.:  [id number]          PATIENT TYPE:  INP   LOCATION:  5157                         FACILITY:  [facility]   PHYSICIAN:  Wilmon Arms. [name], M.D. DATE OF BIRTH:  1945-10-22   DATE OF PROCEDURE:  06/07/2007  DATE OF DISCHARGE:                               OPERATIVE REPORT   PREOPERATIVE DIAGNOSIS:  Descending colostomy status post resection of  diverticular stricture.   POSTOPERATIVE DIAGNOSIS:  Descending colostomy status post resection of  diverticular stricture.   PROCEDURE PERFORMED:  Colostomy closure with creation of a diverting  loop ileostomy.   SURGEON:  Wilmon Arms. [name], MD, FACS   ASSISTANT:  Angelia Mould. Derrell Lolling, MD and Dr. Adolph Pollack, MD   ANESTHESIA:  General endotracheal.   INDICATIONS:  The patient is a 65 year old male who initially presented  several months ago with signs of distal colon obstruction.  He was found  to have a tight diverticular stricture.  He underwent resection of this  disease segment and had a massively dilated proximal colon.  We created  a Hartmann pouch and a descending colostomy.  The patient has done quite  well since then.  His wounds have healed.  He presents now for colostomy  closure.   DESCRIPTION OF PROCEDURE:  The patient had been prepped at home.  He is  given preoperative antibiotics and pneumatic compression hose had been  placed on his legs.  After an adequate level of general anesthesia was  obtained, the patient's legs were placed in a lithotomy position.  A  Foley catheter was placed.  His abdomen was shaved.  The colostomy was  closed with a purse-string 2-0 silk suture.  The entire abdomen was  prepped with Betadine and draped in sterile fashion.  A time-out was  taken to make sure proper patient, proper procedure.  We made an  elliptical incision around the old colostomy site.  We took the old  colostomy down with cautery all the way to the  fascia.  The fascia was  opened.  We are able to dissect into the peritoneal cavity.  There were  some flimsy adhesions on undersurface of the abdominal wall at the  ostomy site.  The end of the colostomy was then amputated with GIA  stapler.  We dropped staple line into the abdominal wound after tag with  a 2-0 silk suture.  The GelPort device was then inserted into the wound.  We insufflated via an 11-mm trocar.  The laparoscope was inserted.  There were dense omental adhesions to the anterior abdominal wall.  We  placed a 5 mm port in the upper midline.  We used a harmonic scalpel to  try and take down some of these omental adhesions.  We were finally able  to visualize down into the pelvis.  We were able to visualize the  Prolene sutures, which had been used to tag the rectal stump.  It seemed  that the rectal stump had pulled all the way down into the pelvic floor.  At this point we made the decision to open  the lower midline to dissect  up the rectal stump.  It did not appear that we would be able to  accomplish his dissection with the laparoscopic instruments.  We removed  the laparoscopic equipment including the GelPort.  We made a lower  midline incision.  Dissection was carried down to the fascia, which was  opened vertically.  The Balfour retractor was inserted.  We dissected  the omentum off the anterior abdominal wall.  We dissected the small  bowel out of the pelvis.  We packed all the small bowel up into the  upper abdomen, retracted this with the Balfour expander.  We then began  mobilizing the rectal stump.  We grasped the Prolene sutures for  traction.  We dissected down along the size of the posterior rectal  stump.  Once it seemed that it was adequately freed, we turned our  attention to the old colostomy site, which had been amputated.  We  mobilized the splenic flexure slightly and this allowed plenty of length  down into the pelvis.  There did not seem to be any  tension at the site  of the potential anastomosis.  We had to lyse small bowel adhesions to  get this out of the way of the descending colon.  We amputated the  staple line and passed the EEA sizers.  A 29-mm sizer passed easily.  We  placed a 29 mm anvil in the proximal stump and secured this with a 2-0  Prolene purse-string around the spike of the anvil.  Dr. Derrell Lolling then  passed the rigid proctoscope per rectum.  There seemed to be a lot of  thickened mucosa in the proximal part of the rectal stump.  He attempted  to pass the EEA stapler, but a lot of resistance in the most proximal  part of the rectal stump.  He then changed gloves and gown and we  further mobilized the rectal stump.  We opened the staple line along the  anterior portion of the rectal stump.  The wall of the rectum seemed  fairly thick.  We mobilized the rectal stump some more and transected  about an inch and a half further distally with a contour stapler.  At  this point, he was able to pass the EEA stapler to the end of the rectal  stump.  The spike was advanced through the rectal wall just anterior to  the staple line.  This was mated with anvil of the proximal stump.  The  staple was then closed down, held for several minutes in the fire.  The  proximal donut was intact.  The distal donut, however, was not intact.  We then insufflated the rectum with air through the rigid proctoscope.  A large amount of air bubbles were seen in the pelvis.  We then  carefully inspected the anastomosis.  It appeared that the anterior wall  of the anastomosis was no longer intact.  We repaired these with five  interrupted 2-0 silk full-thickness sutures.  We then retested the  anastomosis and no leak was noted.  Air passed proximally across the  patent anastomosis.  We thoroughly irrigated the pelvis.  Hemostasis was  adequate.  We brought a 19 round Blake drain through a stab incision in  the left lower quadrant, placed this down in  the pelvis adjacent to the  anastomosis.  We then brought up a loop of terminal ileum through a  small skin and muscle opening in the right lower  quadrant for a loop  colostomy.  We created a bar from a 20-French red rubber catheter.  The  drain and the red rubber catheter were secured with 2-0 nylon sutures.  We thoroughly irrigated again.  The fascia was closed at the old  colostomy site with a 0 PDS suture.  Double-stranded 0 PDS was used to  close the fascia.  Skin staples were used to close all the skin  incisions.  We then matured the loop ileostomy with 3-0 Vicryl sutures.  A bag was applied.  Dry dressings were applied.  The patient was then  extubated, brought to recovery room in stable condition.  All sponge,  instrument, and needle counts were correct.      Wilmon Arms. Tsuei, M.D.  Electronically Signed     MKT/MEDQ  D:  06/07/2007  T:  06/08/2007  Job:  [id number]

## 2010-06-18 NOTE — Op Note (Signed)
Jason Conrad, Jason Conrad             ACCOUNT NO.:  [id number]   MEDICAL RECORD NO.:  [id number]          PATIENT TYPE:  INP   LOCATION:  5152                         FACILITY:  [facility]   PHYSICIAN:  Wilmon Arms. [name], M.D. DATE OF BIRTH:  [birth year]   DATE OF PROCEDURE:  02/24/2007  DATE OF DISCHARGE:                               OPERATIVE REPORT   PREOPERATIVE DIAGNOSES:  1. Colonic obstruction.  2. Diverticular stricture.  3. Cholelithiasis.   POSTOPERATIVE DIAGNOSES:  1. Colonic obstruction.  2. Diverticular stricture.  3. Cholelithiasis.   PROCEDURE:  1. Exploratory laparotomy with Hartmann's procedure, sigmoid colectomy      and sigmoid colostomy.  2. Open cholecystectomy.   SURGEON:  Wilmon Arms. [name], M.D., FACS   ASSISTANT:  Dr. Lindie Spruce.   ANESTHESIA:  General endotracheal.   INDICATIONS:  The patient is a 65 year old male who has had multiple  episodes of diverticulitis as well as known gallstones who presents with  colonic distention.  He has been followed by Dr. Danise Edge for some  time.  He had a colonoscopy in September of 2008 which showed inflamed  sigmoid colon mucosa.  The patient was admitted to the hospital on  January 21 with signs of colonic obstruction.  His colon was distended  up to 8 cm in diameter from the distal sigmoid all the way back to the  cecum.  Dr. Danise Edge admitted him to the hospital and has tried to  decompress him with colonoscopy.  He was able to pass a colonoscope past  the stricture.  His partner, Dr. [name], placed an endoscopic stent.  This was unsuccessful in decompressing the patient.  We were unable to  adequately prep him.  Therefore we recommended a sigmoid colectomy with  a Hartmann's procedure.  The patient will have a temporary colostomy for  several months.   DESCRIPTION OF PROCEDURE:  The patient was brought to the operating room  and placed in the supine position on the operating table.  After an  adequate  level of general anesthesia was obtained a Foley catheter was  placed under sterile technique.  The patient's abdomen was prepped with  Betadine and draped in a sterile fashion.  A time-out was taken to  assure the proper patient and proper procedure.  A vertical midline  incision was made.  Dissection was carried down through the subcutaneous  fat to the fascia.  The fascia was opened vertically.  We entered the  peritoneal cavity and opened it widely.  A very large distended colon  was identified.  This extended from the sigmoid colon all the way around  to the cecum.  We inserted the Bookwalter retractor.  There were dense  inflammatory adhesions down into the pelvis around the sigmoid colon.  We were unable to adequately visualize this area due to the large  distention of the colon.  Therefore, I picked a spot in the mid sigmoid  colon.  I placed a pursestring suture of 2-0 silk.  I then created an  enterotomy in the tinea of the sigmoid colon.  We inserted a pool  suction  and suctioned out over a liter of liquid stool as well as a  large amount of air.  This successfully decompressed the descending and  sigmoid colon to allow [place] to work.  The pursestring suture was then  closed.  We then mobilized the descending and sigmoid colon by dividing  the lateral attachments.  We continued down into the pelvis.  We then  had a lot of dense adhesions to the sigmoid colon.  I was able to get my  hand down the right side of the pelvis and feel what appeared to be  normal rectum but a lot of dense adhesions more proximally.  With  meticulous slow dissection we were able to dissect the sigmoid colon  away from the inflammatory adhesions.  Once we had divided these bands  the sigmoid mobilized up quite well.  We were able to easily palpate the  normal distal sigmoid colon.  We could not feel the stent inside the  colon.  We then divided the big dilated sigmoid colon with a GIA 100 mm  stapler.  This  was done proximal to our previous enterotomy in the mid  sigmoid colon.  The LigaSure device was used to take the mesentery in a  distal fashion.  We had given some indigo carmine previously.  No blue  dye was seen in the pelvis.  We identified a structure that we thought  was the left ureter.  There was no sign of ureteral injury or leak.  We  continued taking the mesocolon down to where we were past the stricture.  The rectum was then divided with a contour stapler with a green load.  The specimen was passed off the field.  We opened it up.  There was a  long stricture with thickened colon wall.  However, we were unable to  identify the stent.  The stent may have migrated more distally in the  rectum.  We then washed out the pelvis thoroughly with saline.  Hemostasis was obtained with cautery.  We then repositioned our  retractors and began the cholecystectomy.   The gallbladder was grasped with a Kelly clamp.  Cautery was used to  dissect the gallbladder free from the liver.  We continued this from the  fundus on down towards the hilum.  The cystic artery was ligated with a  2-0 silk tie.  The cystic duct was ligated with two clips then divided.  The gallbladder was passed off the field and sent for pathologic  examination.  We then thoroughly irrigated the entire abdomen with  several liters of warm saline.  A circle of skin was removed from the  patient's left side at a level above the umbilicus.  We did not bring up  the entire end of the sigmoid colon as it measured about 10 cm across.  We decided instead to pull up the corner.  This was held with a Babcock  clamp.  We inspected the abdomen for any retained sponges.  Our sponge,  instrument and needle count was correct.  We placed several pieces of  Seprafilm in the patient's pelvis as well as around his colostomy.  The  fascia was closed with double-stranded #1 PDS suture.  The subcutaneous  tissues were irrigated and staples were  used to close the skin.  The  colostomy was then matured with interrupted 3-0 silk sutures.  The  cautery was used to open the colon.  We then matured the colostomy with  multiple interrupted  sutures.  An ostomy appliance was cut to fit and  applied to the skin.  The patient was then extubated and brought to  recovery in stable condition.  All sponge, instrument and needle counts  were correct.      Wilmon Arms. Tsuei, M.D.  Electronically Signed     MKT/MEDQ  D:  02/26/2007  T:  02/27/2007  Job:  [id number]   cc:   Danise Edge, M.D.  Rachael Fee, MD

## 2010-06-18 NOTE — Consult Note (Signed)
Jason Conrad, Jason Conrad             ACCOUNT NO.:  [id number]   MEDICAL RECORD NO.:  [id number]          PATIENT TYPE:  INP   LOCATION:  5152                         FACILITY:  [facility]   PHYSICIAN:  Revonda Standard L. Rennis Harding, N.P. DATE OF BIRTH:  04/19/1945   DATE OF CONSULTATION:  02/24/2007  DATE OF DISCHARGE:                                 CONSULTATION   REASON FOR CONSULTATION:  Colonic stricture.   HISTORY OF PRESENT ILLNESS:  Jason Conrad is a 65 year old male patient  with known diverticular disease most recently treated in July 2008 as an  outpatient for acute diverticulitis.  During that workup, he underwent  CT scan that also revealed gallstones.  He was asymptomatic but sent to  Dr. Carolynne Edouard of [facility] surgery for evaluation.  Because he was  asymptomatic, it was felt regarding the gallstones he should just be  monitored, and there were no indications for elective cholecystectomy at  this time.   In September 2008, the patient underwent a colonoscopy that revealed  extensive diverticular disease of the sigmoid colon with significant  mucosal fibrosis as well as a polyp.  The polyp was benign, and the  biopsy of the colon mucosa was benign.  Over the past month, the patient  has had increasing abdominal pain and bloating with change in bowel  movement frequency and consistency.  CT done on February 12, 2007 was  essentially unremarkable except for an area of dilated colon down to an  area of decompressed colon about 8 cm in length that was concerning for  either a significant stricture due to diverticular disease or possible  neoplastic process.  The patient was admitted to [facility]  today by Dr. Laural Benes and underwent a colonoscopy.  Unfortunately, he was  unable to pass his bowel prep prior to coming into the hospital and was  having significant abdominal discomfort preprocedure.  Dr. Laural Benes was  able to get up to the 30 cm level where he was found to have a  significant fibrotic narrowing.  The area was very erythematous.  No  obvious neoplasm was seen.  He was noted to have liquid stool in the  rectum, and actually he was able to pass a smaller scope through this  area to see liquid stool in the proximal and sigmoid colon as well.  The  patient reports having passed some liquid stool since the procedure and  has continued to have intermittent diffuse colicky abdominal pain.  Surgical evaluation has been requested.   REVIEW OF SYSTEMS:  As above.  As noted, intermittent abdominal pain and  bloating for 1 month.  No symptoms consistent with cholecystitis or  cholelithiasis such as right upper quadrant pain or GERD symptoms or  postprandial symptoms in the upper abdomen after eating.  The patient  had been experiencing numbness and burning in the feet, but since he  quit smoking and began medications, this has resolved.   SOCIAL HISTORY:  The patient is a former tobacco user, one to one and  half packs per day.  He quit 22 days ago.  Rare alcohol use.  He is  single.  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   ALLERGIES:  NKDA.   MEDICATIONS AT HOME:  Include:  1. Wellbutrin.  2. Aspirin.  3. Multiple vitamins.   PAST MEDICAL HISTORY:  1. 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  2. Diverticulosis with history of diverticulitis.  3. Asymptomatic gallstones.  4. Diarrhea predominant irritable bowel syndrome.   PAST SURGICAL HISTORY:  1. Open appendectomy right lower quadrant 39 years ago, oblique scar.  2. Shoulder surgery.  3. Ankle surgery.  4. Partial thyroidectomy for benign mass by Dr. Lazarus Salines.   PHYSICAL EXAMINATION:  GENERAL:  Pleasant male patient currently  complaining of abdominal bloating and intermittent colicky abdominal  pain.  VITAL SIGNS:  He is afebrile.  Vital signs are stable.  NEUROLOGICAL:  He is alert and oriented x3, moving all  extremities x4.  No focal deficits.  HEENT:  Head normocephalic.  Sclera noninjected.  NECK:  Supple.  No appreciable adenopathy.  CHEST:  Bilateral lung sounds clear to auscultation.  Respiratory effort  nonlabored.  CARDIAC:  S1-S2.  No rubs, murmurs, thrills or gallops.  Pulse is  regular.  ABDOMEN:  Somewhat obese, distended, slightly tympanitic.  Bowel sounds  are present, mainly in the higher pitched popping tinkling pattern  consistent with an obstructive process.  He has mild diffuse tenderness  without guarding or rebounding.  He does have a right lower quadrant  oblique scar with no hernias.  EXTREMITIES:  Symmetrical in appearance without edema, cyanosis or  clubbing.  He does have varicosities in the lower extremities  bilaterally.  Has normal hair distribution pattern and weak palpable  pulses in the feet.   LABORATORY DATA:  Are pending, CMET and CBC.   DIAGNOSTICS:  CT of the abdomen and pelvis has been done February 22, 2007; again demonstrates a markedly dilated colon to the level of  collapse sigmoid colon with an area 8 cm in length of thickening.   IMPRESSION:  Abdominal pain secondary to sigmoid stricture and suspected  diverticular stricture etiology.   PLAN:  1. Dr. Laural Benes has ordered plain abdominal films to rule out any free      air post colonoscopy; if these films showed no evidence of free      air, we will go ahead and begin slow bowel prep.  2. If the patient can achieve an appropriate bowel prep, then we can      proceed with resection of colon without colostomy procedure.      Otherwise, he will need to have a colostomy and eventual reversal      later.      Allison L. Rennis Harding, N.P.     ALE/MEDQ  D:  02/24/2007  T:  02/24/2007  Job:  [id number]

## 2010-06-21 NOTE — H&P (Signed)
Jason, Conrad             ACCOUNT NO.:  [id number]   MEDICAL RECORD NO.:  [id number]          PATIENT TYPE:  INP   LOCATION:  1523                         FACILITY:  [facility]   PHYSICIAN:  Wilmon Arms. [name], M.D. DATE OF BIRTH:  [birth year]   DATE OF ADMISSION:  09/16/2007  DATE OF DISCHARGE:  09/20/2007                              HISTORY & PHYSICAL   CHIEF COMPLAINT:  Loop ileostomy.   BRIEF HISTORY:  The patient is a 65 year old male who initially was seen  last year with obstructing diverticular stricture.  He underwent a  sigmoid colostomy and was given a descending colostomy at that time.  In  May 2009 he underwent colostomy closure, but due to a difficult  anastomosis, he was given a diverting loop ileostomy.  Since that time  he has done well.  Gastrografin enema shows that the rectal anastomosis  is patent with no sign of leak and partial obstruction.  He presents now  for ileostomy closure.   MEDICATIONS:  None.   ALLERGIES:  None.   PAST SURGICAL HISTORY:  1. Thyroidectomy.  2. Appendectomy.  3. Shoulder surgery.  4. Ankle surgery.  5. Sigmoid colectomy with diverting colostomy and colostomy closure      with a loop ileostomy.   PAST MEDICAL HISTORY:  1. Diverticulitis.  2. Peptic ulcer disease.   SOCIAL HISTORY:  The patient smokes a pack a day, drinks occasionally.   FAMILY HISTORY:  Unremarkable.   REVIEW OF SYSTEMS:  Otherwise negative.   PHYSICAL EXAMINATION:  GENERAL:  This is a well-developed, well-  nourished male in no apparent distress.  HEENT:  EOMI.  Sclerae anicteric.  NECK:  No mass or thyromegaly.  LUNGS:  Clear to auscultation bilaterally.  Normal respiratory effort.  HEART:  Regular rate and rhythm.  No murmur.  ABDOMEN:  Positive bowel sounds.  Well-healed incisions.  He has a right  lower quadrant loop ileostomy which shows no sign of herniation or  prolapse.  EXTREMITIES:  Trace edema.  SKIN:  Warm, dry with no sign of  jaundice.   IMPRESSION:  Loop ileostomy for conversion after a recent rectal  anastomosis.   PLAN:  The loop ileostomy closure.      Wilmon Arms. Tsuei, M.D.  Electronically Signed     MKT/MEDQ  D:  10/01/2007  T:  10/01/2007  Job:  [id number]

## 2010-10-24 LAB — COMPREHENSIVE METABOLIC PANEL
ALT: 28
ALT: 33
AST: 19
AST: 27
Albumin: 3.5
Alkaline Phosphatase: 50
CO2: 26
CO2: 28
Chloride: 102
Chloride: 98
Creatinine, Ser: 1.17
GFR calc Af Amer: >60
GFR calc Af Amer: >60
GFR calc non Af Amer: >60
GFR calc non Af Amer: >60
Potassium: 2.9 — ABNORMAL LOW
Potassium: 4
Sodium: 131 — ABNORMAL LOW
Sodium: 136
Total Bilirubin: 0.9
Total Bilirubin: 1.4 — ABNORMAL HIGH

## 2010-10-24 LAB — CBC
HCT: 38.9 — ABNORMAL LOW
HCT: 51.5
Hemoglobin: 13.8
Hemoglobin: 17.3 — ABNORMAL HIGH
MCHC: 33.6
MCHC: 33.9
MCV: 89.6
Platelets: 164
Platelets: 167
Platelets: 189
RBC: 4.32
RBC: 4.51
RDW: 14.2
RDW: 14.4
WBC: 7.9
WBC: 9.1
WBC: 9.3

## 2010-10-24 LAB — BASIC METABOLIC PANEL
BUN: 3 — ABNORMAL LOW
BUN: 4 — ABNORMAL LOW
BUN: 9
CO2: 22
CO2: 28
Calcium: 7.9 — ABNORMAL LOW
Calcium: 8.2 — ABNORMAL LOW
Calcium: 8.3 — ABNORMAL LOW
Chloride: 103
Chloride: 109
Creatinine, Ser: 1.03
Creatinine, Ser: 1.03
Creatinine, Ser: 1.13
Creatinine, Ser: 1.43
GFR calc Af Amer: >60
GFR calc Af Amer: >60
GFR calc non Af Amer: 50 — ABNORMAL LOW
GFR calc non Af Amer: >60
Glucose, Bld: 105 — ABNORMAL HIGH
Glucose, Bld: 134 — ABNORMAL HIGH
Potassium: 3.3 — ABNORMAL LOW

## 2010-10-29 LAB — COMPREHENSIVE METABOLIC PANEL
Alkaline Phosphatase: 73
BUN: 17
Chloride: 105
Glucose, Bld: 97
Potassium: 4.1
Total Bilirubin: 0.5

## 2010-10-29 LAB — DIFFERENTIAL
Basophils Absolute: 0
Basophils Relative: 0
Neutro Abs: 4.3
Neutrophils Relative %: 55

## 2010-10-29 LAB — CBC
HCT: 49.9
Hemoglobin: 16.8
RDW: 15.4
WBC: 7.8

## 2010-11-01 LAB — CBC
HCT: 50.5
HCT: 51.7
Hemoglobin: 16.9
Hemoglobin: 17.2 — ABNORMAL HIGH
MCHC: 33.4
MCV: 91.3
Platelets: 134 — ABNORMAL LOW
RBC: 5.54
WBC: 8.5

## 2010-11-01 LAB — BASIC METABOLIC PANEL
CO2: 29
Chloride: 102
Creatinine, Ser: 1.18
GFR calc Af Amer: >60
GFR calc non Af Amer: >60
Potassium: 4.4
Potassium: 4.9
Sodium: 140

## 2010-11-01 LAB — DIFFERENTIAL
Eosinophils Relative: 2
Lymphocytes Relative: 25
Lymphs Abs: 2.1

## 2010-11-12 LAB — BONE MARROW EXAM

## 2015-05-01 ENCOUNTER — Ambulatory Visit: Payer: Medicare Other

## 2015-05-08 ENCOUNTER — Ambulatory Visit: Payer: Medicare Other

## 2015-05-10 ENCOUNTER — Encounter: Payer: Medicare Other | Attending: Family Medicine

## 2015-05-10 VITALS — Ht 72.0 in | Wt 238.2 lb

## 2015-05-10 DIAGNOSIS — E1165 Type 2 diabetes mellitus with hyperglycemia: Secondary | ICD-10-CM | POA: Diagnosis present

## 2015-05-10 DIAGNOSIS — E119 Type 2 diabetes mellitus without complications: Secondary | ICD-10-CM

## 2015-05-10 NOTE — Progress Notes (Signed)

## 2015-05-15 ENCOUNTER — Ambulatory Visit: Payer: Medicare Other

## 2015-05-17 ENCOUNTER — Ambulatory Visit: Payer: Medicare Other

## 2015-05-17 DIAGNOSIS — E1165 Type 2 diabetes mellitus with hyperglycemia: Secondary | ICD-10-CM | POA: Diagnosis not present

## 2015-05-17 NOTE — Progress Notes (Signed)

## 2015-05-24 ENCOUNTER — Ambulatory Visit: Payer: Medicare Other

## 2015-06-05 ENCOUNTER — Ambulatory Visit: Payer: Self-pay

## 2015-07-25 DIAGNOSIS — Z7984 Long term (current) use of oral hypoglycemic drugs: Secondary | ICD-10-CM | POA: Diagnosis not present

## 2015-07-25 DIAGNOSIS — G629 Polyneuropathy, unspecified: Secondary | ICD-10-CM | POA: Diagnosis not present

## 2015-07-25 DIAGNOSIS — E1165 Type 2 diabetes mellitus with hyperglycemia: Secondary | ICD-10-CM | POA: Diagnosis not present

## 2015-10-25 DIAGNOSIS — Z23 Encounter for immunization: Secondary | ICD-10-CM | POA: Diagnosis not present

## 2015-10-25 DIAGNOSIS — Z7984 Long term (current) use of oral hypoglycemic drugs: Secondary | ICD-10-CM | POA: Diagnosis not present

## 2015-10-25 DIAGNOSIS — I10 Essential (primary) hypertension: Secondary | ICD-10-CM | POA: Diagnosis not present

## 2015-10-25 DIAGNOSIS — E1165 Type 2 diabetes mellitus with hyperglycemia: Secondary | ICD-10-CM | POA: Diagnosis not present

## 2016-04-23 DIAGNOSIS — R260 Ataxic gait: Secondary | ICD-10-CM | POA: Diagnosis present

## 2016-04-23 DIAGNOSIS — R404 Transient alteration of awareness: Secondary | ICD-10-CM | POA: Diagnosis not present

## 2016-04-23 DIAGNOSIS — H818X1 Other disorders of vestibular function, right ear: Secondary | ICD-10-CM | POA: Diagnosis not present

## 2016-04-23 DIAGNOSIS — D751 Secondary polycythemia: Secondary | ICD-10-CM | POA: Diagnosis present

## 2016-04-23 DIAGNOSIS — R531 Weakness: Secondary | ICD-10-CM | POA: Diagnosis not present

## 2016-04-23 DIAGNOSIS — E785 Hyperlipidemia, unspecified: Secondary | ICD-10-CM | POA: Diagnosis present

## 2016-04-23 DIAGNOSIS — G629 Polyneuropathy, unspecified: Secondary | ICD-10-CM | POA: Diagnosis present

## 2016-04-23 DIAGNOSIS — H5509 Other forms of nystagmus: Secondary | ICD-10-CM | POA: Diagnosis present

## 2016-04-23 DIAGNOSIS — F17200 Nicotine dependence, unspecified, uncomplicated: Secondary | ICD-10-CM | POA: Diagnosis present

## 2016-04-23 DIAGNOSIS — I10 Essential (primary) hypertension: Secondary | ICD-10-CM | POA: Diagnosis present

## 2016-04-23 DIAGNOSIS — E860 Dehydration: Secondary | ICD-10-CM | POA: Diagnosis not present

## 2016-08-20 DIAGNOSIS — R42 Dizziness and giddiness: Secondary | ICD-10-CM | POA: Diagnosis not present

## 2016-08-20 DIAGNOSIS — G629 Polyneuropathy, unspecified: Secondary | ICD-10-CM | POA: Diagnosis not present

## 2016-08-20 DIAGNOSIS — I10 Essential (primary) hypertension: Secondary | ICD-10-CM | POA: Diagnosis not present

## 2016-08-20 DIAGNOSIS — E1165 Type 2 diabetes mellitus with hyperglycemia: Secondary | ICD-10-CM | POA: Diagnosis not present

## 2016-08-20 DIAGNOSIS — E782 Mixed hyperlipidemia: Secondary | ICD-10-CM | POA: Diagnosis not present

## 2016-10-08 DIAGNOSIS — H93293 Other abnormal auditory perceptions, bilateral: Secondary | ICD-10-CM | POA: Diagnosis not present

## 2016-10-08 DIAGNOSIS — H838X3 Other specified diseases of inner ear, bilateral: Secondary | ICD-10-CM | POA: Diagnosis not present

## 2016-10-08 DIAGNOSIS — E041 Nontoxic single thyroid nodule: Secondary | ICD-10-CM | POA: Diagnosis not present

## 2016-10-08 DIAGNOSIS — H8112 Benign paroxysmal vertigo, left ear: Secondary | ICD-10-CM | POA: Diagnosis not present

## 2016-10-31 DIAGNOSIS — R262 Difficulty in walking, not elsewhere classified: Secondary | ICD-10-CM | POA: Diagnosis not present

## 2016-10-31 DIAGNOSIS — H8112 Benign paroxysmal vertigo, left ear: Secondary | ICD-10-CM | POA: Diagnosis not present

## 2016-11-06 DIAGNOSIS — H8112 Benign paroxysmal vertigo, left ear: Secondary | ICD-10-CM | POA: Diagnosis not present

## 2016-11-06 DIAGNOSIS — R262 Difficulty in walking, not elsewhere classified: Secondary | ICD-10-CM | POA: Diagnosis not present

## 2016-11-12 DIAGNOSIS — H8112 Benign paroxysmal vertigo, left ear: Secondary | ICD-10-CM | POA: Diagnosis not present

## 2016-11-12 DIAGNOSIS — R262 Difficulty in walking, not elsewhere classified: Secondary | ICD-10-CM | POA: Diagnosis not present

## 2016-12-17 DIAGNOSIS — R262 Difficulty in walking, not elsewhere classified: Secondary | ICD-10-CM | POA: Diagnosis not present

## 2016-12-17 DIAGNOSIS — H8112 Benign paroxysmal vertigo, left ear: Secondary | ICD-10-CM | POA: Diagnosis not present

## 2016-12-18 DIAGNOSIS — E1165 Type 2 diabetes mellitus with hyperglycemia: Secondary | ICD-10-CM | POA: Diagnosis not present

## 2016-12-18 DIAGNOSIS — G629 Polyneuropathy, unspecified: Secondary | ICD-10-CM | POA: Diagnosis not present

## 2016-12-18 DIAGNOSIS — Z125 Encounter for screening for malignant neoplasm of prostate: Secondary | ICD-10-CM | POA: Diagnosis not present

## 2016-12-18 DIAGNOSIS — Z0000 Encounter for general adult medical examination without abnormal findings: Secondary | ICD-10-CM | POA: Diagnosis not present

## 2016-12-18 DIAGNOSIS — M4310 Spondylolisthesis, site unspecified: Secondary | ICD-10-CM | POA: Diagnosis not present

## 2016-12-18 DIAGNOSIS — E782 Mixed hyperlipidemia: Secondary | ICD-10-CM | POA: Diagnosis not present

## 2016-12-18 DIAGNOSIS — Z23 Encounter for immunization: Secondary | ICD-10-CM | POA: Diagnosis not present

## 2016-12-18 DIAGNOSIS — L409 Psoriasis, unspecified: Secondary | ICD-10-CM | POA: Diagnosis not present

## 2016-12-18 DIAGNOSIS — Z79899 Other long term (current) drug therapy: Secondary | ICD-10-CM | POA: Diagnosis not present

## 2017-02-06 ENCOUNTER — Other Ambulatory Visit: Payer: Self-pay | Admitting: Family Medicine

## 2018-07-22 ENCOUNTER — Other Ambulatory Visit: Payer: Self-pay | Admitting: Family Medicine

## 2018-07-22 DIAGNOSIS — Z0000 Encounter for general adult medical examination without abnormal findings: Secondary | ICD-10-CM

## 2018-07-22 DIAGNOSIS — I10 Essential (primary) hypertension: Secondary | ICD-10-CM

## 2018-07-22 DIAGNOSIS — E1165 Type 2 diabetes mellitus with hyperglycemia: Secondary | ICD-10-CM

## 2018-08-05 ENCOUNTER — Ambulatory Visit: Payer: Medicare Other

## 2018-08-17 ENCOUNTER — Ambulatory Visit: Payer: Medicare Other

## 2018-08-25 ENCOUNTER — Ambulatory Visit: Payer: Medicare Other
